# Patient Record
Sex: Female | Born: 1960 | Race: White | Hispanic: No | Marital: Married
Health system: Southern US, Community
[De-identification: ages and names within clinical notes are randomized; demographics above are authoritative.]

---

## 2000-01-07 ENCOUNTER — Other Ambulatory Visit: Admission: RE | Admit: 2000-01-07 | Discharge: 2000-01-07 | Payer: Self-pay | Admitting: Gynecology

## 2002-07-06 ENCOUNTER — Other Ambulatory Visit: Admission: RE | Admit: 2002-07-06 | Discharge: 2002-07-06 | Payer: Self-pay | Admitting: Gynecology

## 2008-06-10 ENCOUNTER — Encounter: Admission: RE | Admit: 2008-06-10 | Discharge: 2008-06-10 | Payer: Self-pay | Admitting: Gynecology

## 2013-06-25 ENCOUNTER — Ambulatory Visit: Payer: Self-pay

## 2013-08-12 ENCOUNTER — Other Ambulatory Visit: Payer: Self-pay | Admitting: Obstetrics & Gynecology

## 2013-08-12 DIAGNOSIS — R102 Pelvic and perineal pain: Secondary | ICD-10-CM

## 2013-08-12 DIAGNOSIS — R109 Unspecified abdominal pain: Secondary | ICD-10-CM

## 2013-08-18 ENCOUNTER — Ambulatory Visit
Admission: RE | Admit: 2013-08-18 | Discharge: 2013-08-18 | Disposition: A | Discharge status: Home / Self Care | Payer: BC Managed Care – PPO | Source: Ambulatory Visit | Attending: Obstetrics & Gynecology | Admitting: Obstetrics & Gynecology

## 2013-08-18 DIAGNOSIS — R102 Pelvic and perineal pain: Secondary | ICD-10-CM

## 2013-08-18 DIAGNOSIS — R109 Unspecified abdominal pain: Secondary | ICD-10-CM

## 2013-08-18 MED ORDER — IOHEXOL 300 MG/ML  SOLN
100.0000 mL | Freq: Once | INTRAMUSCULAR | Status: AC | PRN
  Administered 2013-08-18: 100 mL via INTRAVENOUS

## 2015-06-02 ENCOUNTER — Other Ambulatory Visit: Payer: Self-pay

## 2015-06-02 ENCOUNTER — Ambulatory Visit (HOSPITAL_BASED_OUTPATIENT_CLINIC_OR_DEPARTMENT_OTHER): Payer: Self-pay

## 2015-06-02 VITALS — BP 130/88 | HR 74 | Temp 97.9°F | Resp 16 | Ht 63.5 in | Wt 195.0 lb

## 2015-06-02 DIAGNOSIS — Z0000 Encounter for general adult medical examination without abnormal findings: Secondary | ICD-10-CM

## 2015-06-02 LAB — LIPID PANEL
CHOLESTEROL TOTAL: 227 mg/dL — AB (ref 100–199)
Chol/HDL Ratio: 2.8 ratio units (ref 0.0–4.4)
HDL: 82 mg/dL (ref >39)
LDL CALC: 117 mg/dL — AB (ref 0–99)
TRIGLYCERIDES: 140 mg/dL (ref 0–149)
VLDL CHOLESTEROL CAL: 28 mg/dL (ref 5–40)

## 2015-06-02 LAB — GLUCOSE (CC13): Glucose: 90 mg/dl (ref 70–140)

## 2015-06-02 NOTE — Patient Instructions (Signed)
Discussed health assessment with patient.  Let patient know that will call her next week with lab results. Patient verbalized understanding.

## 2015-06-02 NOTE — Progress Notes (Signed)
Patient is a new patient to the [facility] program .  Clinical Measurements: Patient is 5 ft. 31/2 inches, weight 196 lbs, BMI 34.1 .  Medical History: Patient has no history of high cholesterol. Patient does not have a history of hypertension or diabetes. Per patient no diagnosed history of coronary heart disease, heart attack, heart failure, stroke/TIA, vascular disease or congenital heart defects.   Blood Pressure, Self-measurement: Patient states has no reason to check Blood pressure.  Nutrition Assessment: Patient stated that eats 1 fruit every day. Patient states she eats one servings of vegetables a day. Per patient does eat32 or more ounces of whole grains daily. Patient stated doesn't eat two or more servings of fish weekly. Patient states she does drink more than 36 ounces or 450 calories of beverages with added sugars weekly. Patient stated she does watch her salt intake.   Physical Activity Assessment: Patient stated she does only sixty minutes of moderated exercise a week except when chasing children.  Smoking Status: Patient had never smoked and is not exposed to smoke.  Quality of Life Assessment: In assessing patient's quality of life she stated that out of the past 30 days that she has felt her health is good all of them. Patient also stated that in the past 30 days that her mental health was not good including stress, depression and problems with emotions for 7 days. Patient did state that out of the past 30 days she felt her physical or mental health had not kept her from doing her usual activities including self-care, work or recreation.   Plan: Lab work will be done today including a lipid panel, blood glucose, and Hgb A1C. Will call lab results when they are finished. Patient will be called for Health Coaching and risk reduction with lab results.Marland Kitchen

## 2015-06-03 LAB — HEMOGLOBIN A1C
Est. average glucose Bld gHb Est-mCnc: 120 mg/dL
Hemoglobin A1c: 5.8 % — ABNORMAL HIGH (ref 4.8–5.6)

## 2015-06-05 ENCOUNTER — Telehealth: Payer: Self-pay

## 2015-06-05 NOTE — Telephone Encounter (Signed)
LAB RESULTS and RISK REDUCTION  HEALTH COACHING: Called to inform about lab work from 06/02/15. I informed patient: BMI 34.1 , cholesterol- 227, HDL- 82, LDL- 117, triglycerides - 161, Bld Glucose -90 and HBG-A1C - 5.8. Did risk reduction counseling concerning BMI. Informed that normal BMI was 18 to 25 and patient is in Obese range. Discussed portion sizes and avoiding fatty food. Informed patient needs to stay away from drinks and food that have too much sugar. Discussed that if lost 10% body weight that would help greatly. Annetta stated that had lost 25 lbs and has put weight all back on Will call back to see if want to continue Health Coaching by phone concerning person or come in persn. nutrition and exercise to obtain a health weight.

## 2016-02-12 IMAGING — CT CT ABD-PELV W/ CM
2 of 6 series · 17 of 46 positions shown, 19 images · IV contrast (READICAT/WATER & [ID] OMNI 300)
Comparison: None.

CLINICAL DATA: Left lower quadrant pain.  Recent constipation.

EXAM:
CT ABDOMEN AND PELVIS WITH CONTRAST
TECHNIQUE: Multidetector CT imaging of the abdomen and pelvis was performed
using the standard protocol following bolus administration of
intravenous contrast.
CONTRAST:  100mL OMNIPAQUE IOHEXOL 300 MG/ML  SOLN

[Series 2: abd/pelvis with · axial · 0.70mm/px · z∈[-414,-14]mm · 14 of 92 slices shown, 16 images]
[im 6/92  soft-tissue]
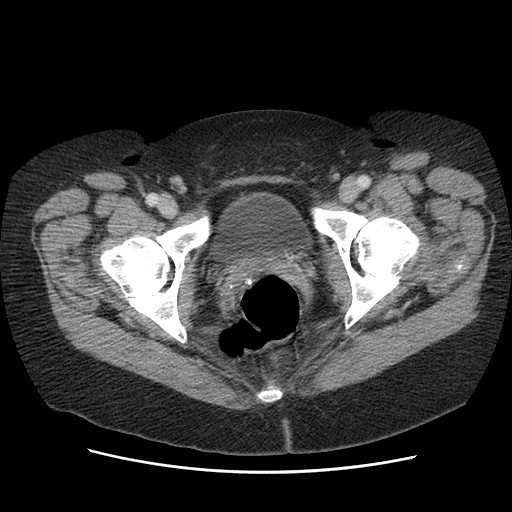
[im 6/92  bone]
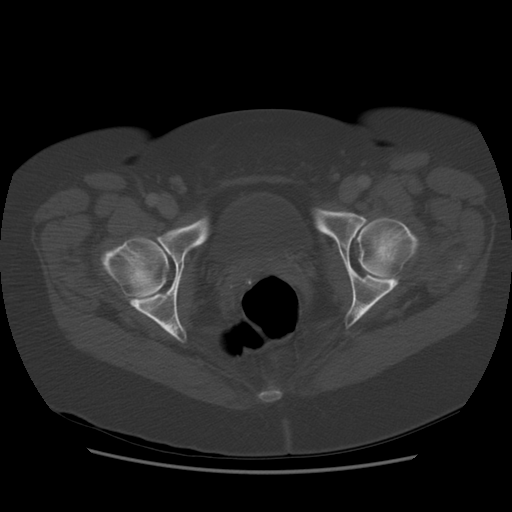
[im 11/92  soft-tissue]
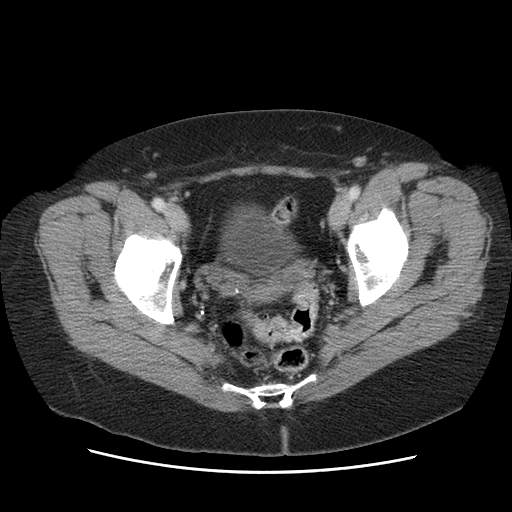
[im 21/92  soft-tissue]
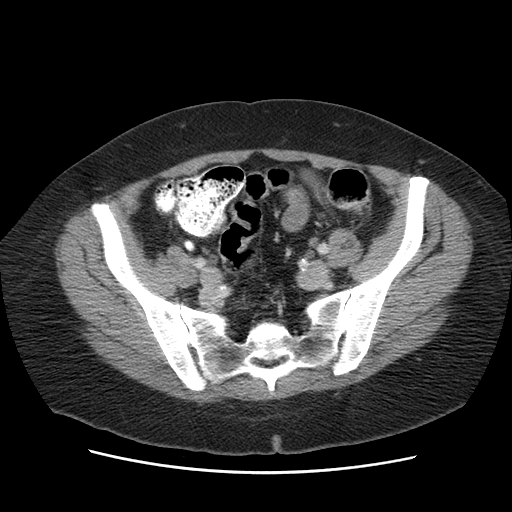
[im 26/92  soft-tissue]
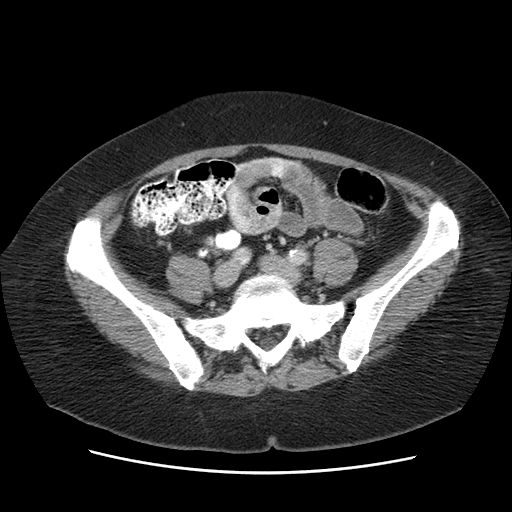
[im 31/92  soft-tissue]
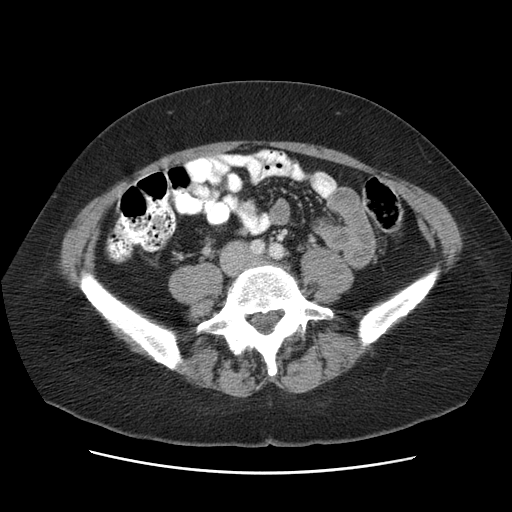
[im 36/92  soft-tissue]
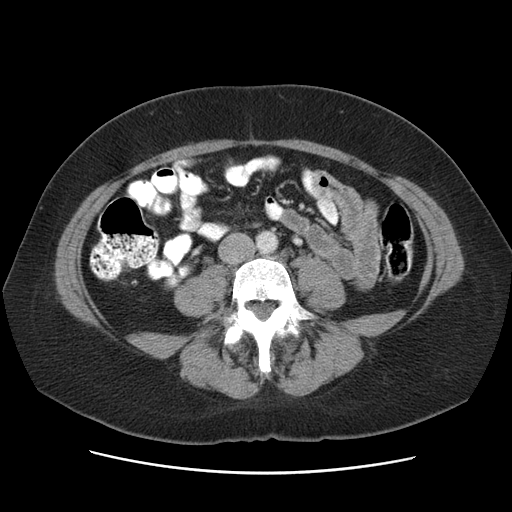
[im 41/92  soft-tissue]
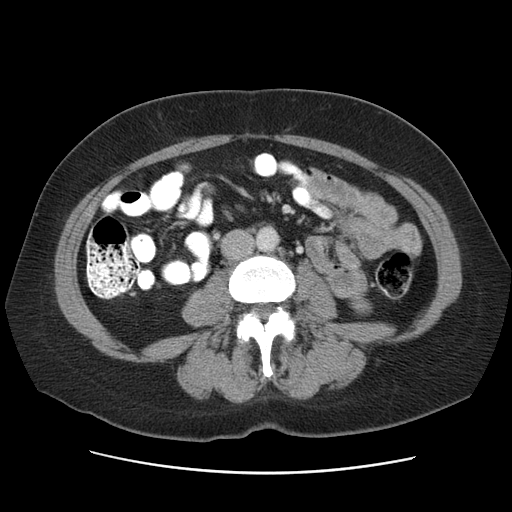
[im 51/92  soft-tissue]
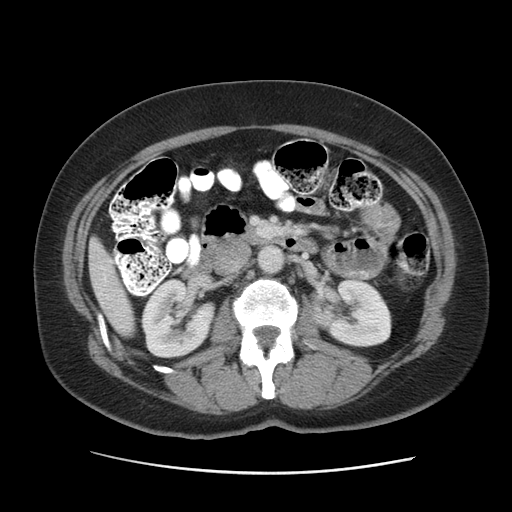
[im 56/92  soft-tissue]
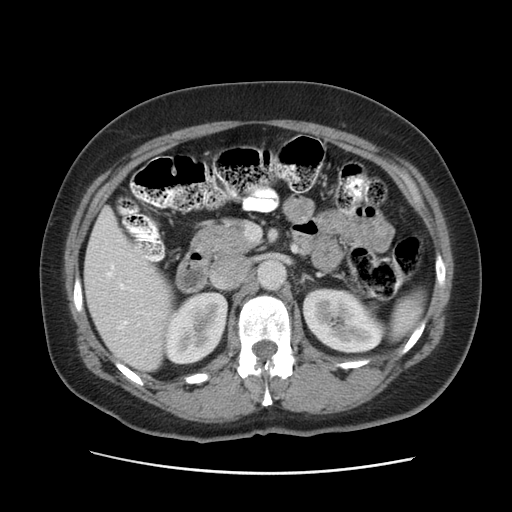
[im 56/92  bone]
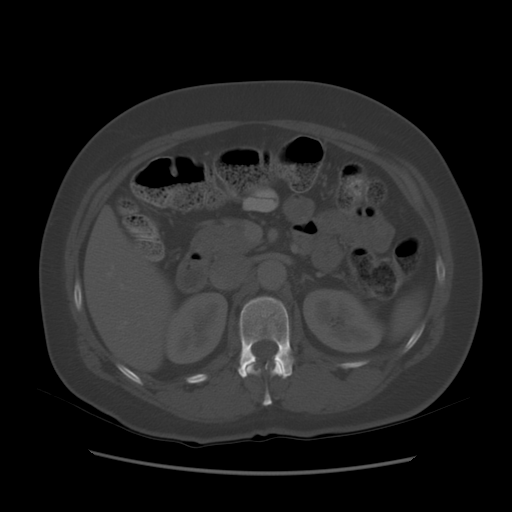
[im 61/92  soft-tissue]
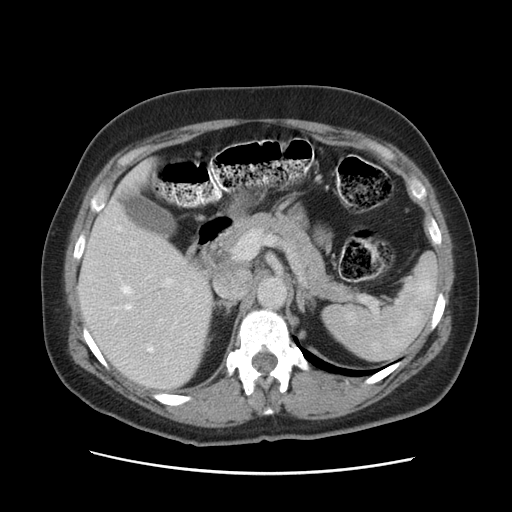
[im 66/92  soft-tissue]
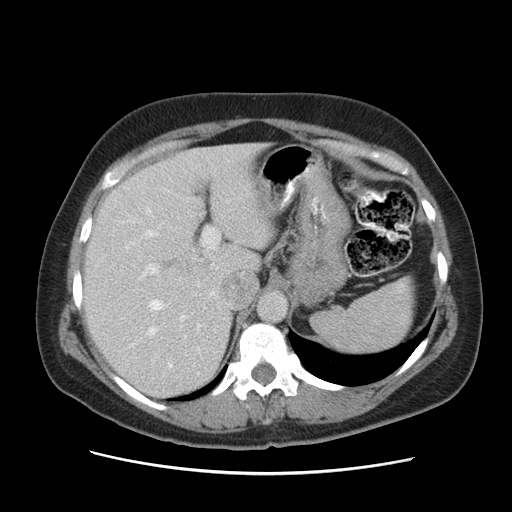
[im 71/92  soft-tissue]
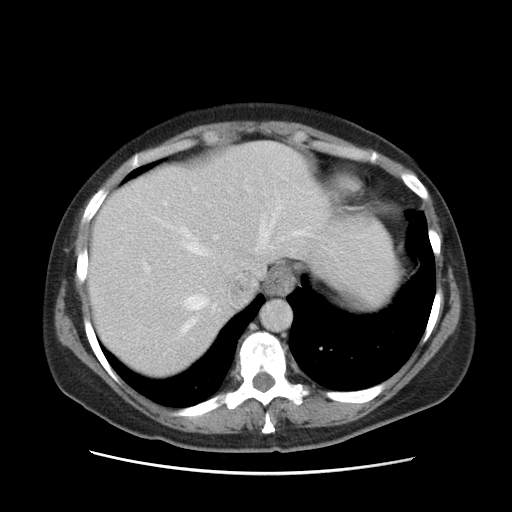
[im 81/92  soft-tissue]
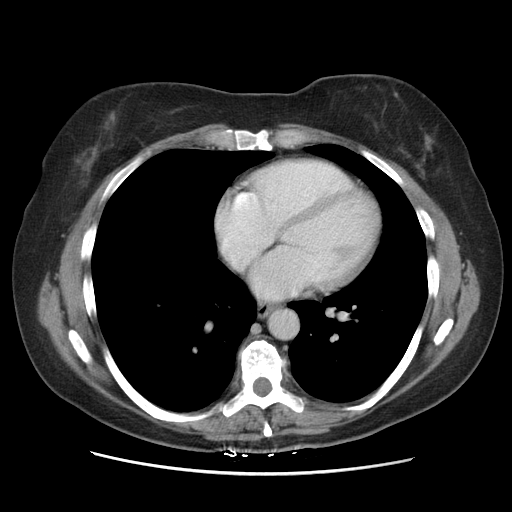
[im 86/92  soft-tissue]
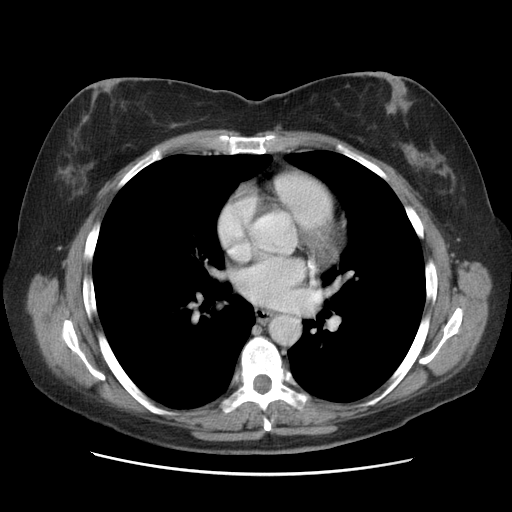

[Series 103: cor · coronal · 1.04mm/px · 3 of 126 slices shown]
[im 42/126  soft-tissue]
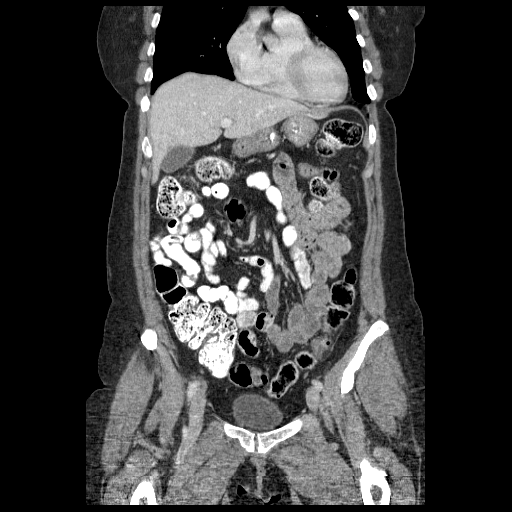
[im 56/126  soft-tissue]
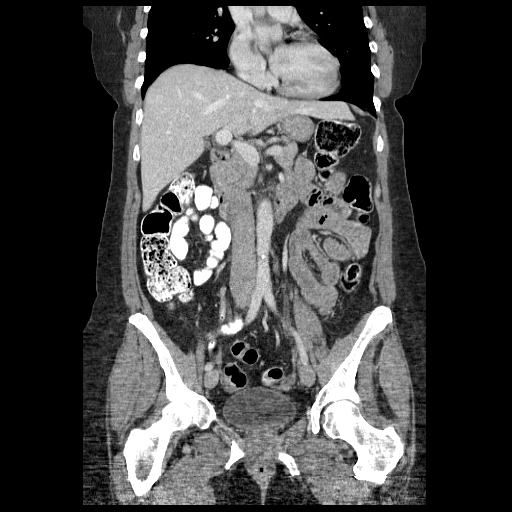
[im 70/126  soft-tissue]
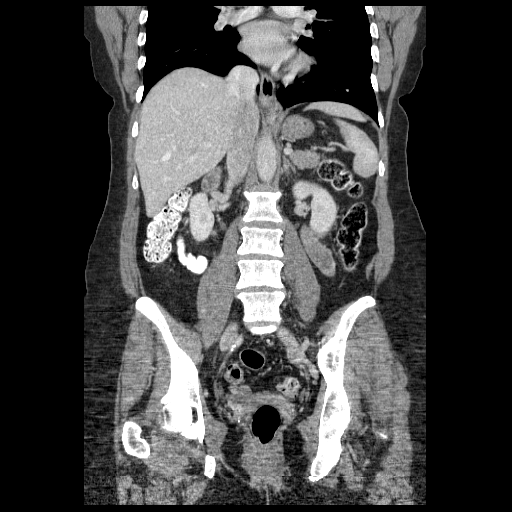

[17 of 46 positions shown; findings below may reference images not displayed]

FINDINGS: Lung bases show no acute findings. Heart size normal. No pericardial
or pleural effusion.

Liver, gallbladder, adrenal glands, kidneys, spleen, pancreas,
stomach and bowel are unremarkable. Hysterectomy. Ovaries are
visualized. Bladder is grossly unremarkable. No pathologically
enlarged lymph nodes. Atherosclerotic calcification of the arterial
vasculature without abdominal aortic aneurysm. Circumaortic left
renal vein. No worrisome lytic or sclerotic lesions. Degenerative
changes are seen in the spine.
IMPRESSION: No findings to explain the patient's pain.

## 2017-01-27 ENCOUNTER — Encounter (HOSPITAL_COMMUNITY): Payer: Self-pay
